# Patient Record
Sex: Female | Born: 2005 | Race: Black or African American | Hispanic: No | Marital: Single | Smoking: Never smoker
Health system: Southern US, Community
[De-identification: ages and names within clinical notes are randomized; demographics above are authoritative.]

## PROBLEM LIST (undated history)

## (undated) DIAGNOSIS — R17 Unspecified jaundice: Secondary | ICD-10-CM

## (undated) DIAGNOSIS — K429 Umbilical hernia without obstruction or gangrene: Secondary | ICD-10-CM

## (undated) DIAGNOSIS — L309 Dermatitis, unspecified: Secondary | ICD-10-CM

## (undated) DIAGNOSIS — J189 Pneumonia, unspecified organism: Secondary | ICD-10-CM

## (undated) HISTORY — PX: OTHER SURGICAL HISTORY: SHX169

## (undated) HISTORY — DX: Dermatitis, unspecified: L30.9

## (undated) HISTORY — DX: Unspecified jaundice: R17

## (undated) HISTORY — DX: Umbilical hernia without obstruction or gangrene: K42.9

## (undated) HISTORY — DX: Pneumonia, unspecified organism: J18.9

## (undated) HISTORY — PX: HERNIA REPAIR: SHX51

---
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
## 2023-02-13 ENCOUNTER — Encounter: Payer: Self-pay | Admitting: Internal Medicine

## 2023-02-13 MED ORDER — HAILEY 24 FE 1-20 MG-MCG(24) PO TABS: 1.0000 | ORAL_TABLET | Freq: Every day | ORAL | 3 refills | Status: DC

## 2023-06-04 ENCOUNTER — Encounter: Payer: Self-pay | Admitting: Internal Medicine

## 2023-07-02 ENCOUNTER — Telehealth (INDEPENDENT_AMBULATORY_CARE_PROVIDER_SITE_OTHER): Admitting: Internal Medicine

## 2023-07-02 VITALS — Ht 60.43 in | Wt 110.0 lb

## 2023-07-02 DIAGNOSIS — Z889 Allergy status to unspecified drugs, medicaments and biological substances status: Secondary | ICD-10-CM | POA: Diagnosis not present

## 2023-07-02 DIAGNOSIS — R220 Localized swelling, mass and lump, head: Secondary | ICD-10-CM | POA: Diagnosis not present

## 2023-07-02 DIAGNOSIS — Z91018 Allergy to other foods: Secondary | ICD-10-CM | POA: Diagnosis not present

## 2023-07-02 DIAGNOSIS — Z91013 Allergy to seafood: Secondary | ICD-10-CM | POA: Diagnosis not present

## 2023-07-02 NOTE — Progress Notes (Signed)
 Virtual Visit via Video Note  I connected with Karen Durham on 07/02/23 at  8:30 AM EDT by a video enabled telemedicine application and verified that I am speaking with the correct person using two identifiers. Location patient: home Location provider:work office Persons participating in the virtual visit: patient, provider   Patient aware  of the limitations of evaluation and management by telemedicine and  availability of in person appointments. and agreed to proceed.   HPI: [name] presents for video visit to disc allergy referral for an event that happened this spring Was getting hair done and had chocking episode   hard to breath and cough?  tried inhaler (given to her from health center in past) and noted  face was  swollen and eyes  took benadryl   ? Blanket  no other exposures.    No itching   swelling 2 days for  breathing got better with benadryl.  His of tree nuts fish t allergies  manifested with itching tongu and maybe swelling but has avoided for a long time  self rx benadryl if needed  moved out  of school dorm and cough is ? Better?  To go back in August for fall semester   of school .   Had bronchitis and  persistent cough  early winter but was recovered except ? A persistent cough .  She has been for hair  appts since then and no sx  the only thing different was the blanket used to cover and has avoided   No new meds  Currently she has no sx   Only reg med is ocps and doing ok withthis  ROS: See pertinent positives and negatives per HPI.  Past Medical History:  Diagnosis Date   Eczema    topical steroids and moisturizers   Failed vision screen 08/05/2012   Jaundice    bili 17   Pneumonia    Umbilical hernia     Past Surgical History:  Procedure Laterality Date   HERNIA REPAIR     umbi   surgical removal of supranumary digit tied off       Family History  Problem Relation Age of Onset   Hypertension Other        father's side   Allergy  (severe) Other     Social History   Tobacco Use   Smoking status: Never   Smokeless tobacco: Never  Vaping Use   Vaping status: Never Used  Substance Use Topics   Alcohol use: Not Currently   Drug use: Never      Current Outpatient Medications:    ibuprofen (ADVIL,MOTRIN) 200 MG tablet, Take 200 mg by mouth every 6 (six) hours as needed., Disp: , Rfl:    Norethindrone Acetate-Ethinyl Estrad-FE (HAILEY  24 FE) 1-20 MG-MCG(24) tablet, Take 1 tablet by mouth daily., Disp: 84 tablet, Rfl: 3   amoxicillin -clavulanate (AUGMENTIN ) 875-125 MG tablet, Take 1 tablet by mouth 2 (two) times daily. (Patient not taking: Reported on 07/02/2023), Disp: 20 tablet, Rfl: 0   azithromycin  (ZITHROMAX  Z-PAK) 250 MG tablet, As directed (Patient not taking: Reported on 07/02/2023), Disp: 6 each, Rfl: 0   benzonatate  (TESSALON ) 200 MG capsule, Take 1 capsule (200 mg total) by mouth 3 (three) times daily as needed for cough. (Patient not taking: Reported on 07/02/2023), Disp: 30 capsule, Rfl: 0  EXAM: BP Readings from Last 3 Encounters:  10/22/22 98/68  08/02/22 120/78  07/05/21 120/80 (88%, Z = 1.17 /  96%, Z =  1.75)*   *BP percentiles are based on the 2017 AAP Clinical Practice Guideline for girls    VITALS per patient if applicable:  GENERAL: alert, oriented, appears well and in no acute distress  HEENT: atraumatic, conjunttiva clear, no obvious abnormalities on inspection of external nose and ears  NECK: normal movements of the head and neck  LUNGS: on inspection no signs of respiratory distress, breathing rate appears normal, no obvious gross SOB, gasping or wheezing  CV: no obvious cyanosis  MS: moves all visible extremities without noticeable abnormality  PSYCH/NEURO: pleasant and cooperative, no obvious depression or anxiety, speech and thought processing grossly intact   ASSESSMENT AND PLAN:  Discussed the following assessment and plan:    ICD-10-CM   1. Swelling of face episode w resp  sx  R22.0    may have been angioedema resp sx  allergic reaction    2. 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   Expectant management and discussion of plan and treatment with opportunity to ask questions and all were answered. The patient agreed with the plan and demonstrated an understanding of the instructions.   Advised to call back or seek an in-person evaluation if worsening  or having  further concerns  in interim. Return for when planned.    Apolinar Eastern, MD

## 2023-07-02 NOTE — Patient Instructions (Addendum)
 Allergy referral  Seek emergent care if any return and carry benadryl for prn use Avoidance to continue

## 2023-08-04 NOTE — Progress Notes (Unsigned)
 New Patient Note  RE: Karen Durham MRN: [id number] DOB: 11-30-2005 Date of Office Visit: 08/05/2023  Consult requested by: Charlett Apolinar POUR, MD Primary care provider: Charlett Apolinar POUR, MD  Chief Complaint: No chief complaint on file.  History of Present Illness: I had the pleasure of seeing Karen Durham for initial evaluation at the [facility] on 08/05/2023. She is a 18 y.o. 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  Discussed the use of AI scribe software for clinical note transcription with the patient, who gave verbal consent to proceed.  History of Present Illness             Swelling started about *** ago. Mainly occurs on her ***. Describes them as ***. Individual swelling episodes last about ***. No ecchymosis upon resolution. Associated symptoms include: ***.  Frequency of episodes: ***. Suspected triggers are ***. Denies any *** fevers, chills, changes in medications, foods, personal care products or recent infections. She has tried the following therapies: *** with *** benefit. Systemic steroids ***. Currently on ***.  Previous work up includes: ***. Previous history of swelling: {Blank single:19197::yes,no}. Family history of angioedema: {Blank single:19197::yes,no}. Patient is up to date with the following cancer screening tests: ***. Ace-inhibitor use: {Blank single:19197::yes,no}  Assessment and Plan: Karen Durham is a 18 y.o. female with: ***  Assessment and Plan               No follow-ups on file.  No orders of the defined types were placed in this encounter.  Lab Orders  No laboratory test(s) ordered today    Other allergy screening: Asthma: {Blank single:19197::yes,no} Rhino conjunctivitis: {Blank single:19197::yes,no} Food allergy: {Blank single:19197::yes,no} Medication allergy: {Blank single:19197::yes,no} Hymenoptera allergy: {Blank  single:19197::yes,no} Urticaria: {Blank single:19197::yes,no} Eczema:{Blank single:19197::yes,no} History of recurrent infections suggestive of immunodeficency: {Blank single:19197::yes,no}  Diagnostics: Spirometry:  Tracings reviewed. Her effort: {Blank single:19197::Good reproducible efforts.,It was hard to get consistent efforts and there is a question as to whether this reflects a maximal maneuver.,Poor effort, data can not be interpreted.} FVC: ***L FEV1: ***L, ***% predicted FEV1/FVC ratio: ***% Interpretation: {Blank single:19197::Spirometry consistent with mild obstructive disease,Spirometry consistent with moderate obstructive disease,Spirometry consistent with severe obstructive disease,Spirometry consistent with possible restrictive disease,Spirometry consistent with mixed obstructive and restrictive disease,Spirometry uninterpretable due to technique,Spirometry consistent with normal pattern,No overt abnormalities noted given today's efforts}.  Please see scanned spirometry results for details.  Skin Testing: {Blank single:19197::Select foods,Environmental allergy panel,Environmental allergy panel and select foods,Food allergy panel,None,Deferred due to recent antihistamines use}. *** Results discussed with patient/family.   Past Medical History: Patient Active Problem List   Diagnosis Date Noted  . Wears glasses 06/23/2018  . Health check for child over 23 days old 02/03/2011  . Eczema 02/03/2011  . Eczema   . Allergic rhinitis 11/27/2006  . ALLERGY-FOOD 11/27/2006   Past Medical History:  Diagnosis Date  . Eczema    topical steroids and moisturizers  . Failed vision screen 08/05/2012  . Jaundice    bili 17  . Pneumonia   . Umbilical hernia    Past Surgical History: Past Surgical History:  Procedure Laterality Date  . HERNIA REPAIR     umbi  . surgical removal of supranumary digit tied off      Medication List:   Current Outpatient Medications  Medication Sig Dispense Refill  . amoxicillin -clavulanate (AUGMENTIN ) 875-125 MG tablet Take 1 tablet by mouth 2 (two) times daily. (Patient not taking:  Reported on 07/02/2023) 20 tablet 0  . azithromycin  (ZITHROMAX  Z-PAK) 250 MG tablet As directed (Patient not taking: Reported on 07/02/2023) 6 each 0  . benzonatate  (TESSALON ) 200 MG capsule Take 1 capsule (200 mg total) by mouth 3 (three) times daily as needed for cough. (Patient not taking: Reported on 07/02/2023) 30 capsule 0  . ibuprofen (ADVIL,MOTRIN) 200 MG tablet Take 200 mg by mouth every 6 (six) hours as needed.    . Norethindrone Acetate-Ethinyl Estrad-FE (HAILEY  24 FE) 1-20 MG-MCG(24) tablet Take 1 tablet by mouth daily. 84 tablet 3   No current facility-administered medications for this visit.   Allergies: Allergies  Allergen Reactions  . Fish Allergy    . Peanut-Containing Drug Products Hives   Social History: Social History   Socioeconomic History  . Marital status: Single    Spouse name: Not on file  . Number of children: Not on file  . Years of education: Not on file  . Highest education level: 12th grade  Occupational History  . Not on file  Tobacco Use  . Smoking status: Never  . Smokeless tobacco: Never  Vaping Use  . Vaping status: Never Used  Substance and Sexual Activity  . Alcohol use: Not Currently  . Drug use: Never  . Sexual activity: Never  Other Topics Concern  . Not on file  Social History Narrative   HH of 4   Younger sib at home chloe entering [employer] same school   No pets   Carliss and Sheldon Trompeter Parents      [employer]   [employer]  3 [employer]  Good grades    Social Drivers of Corporate investment banker Strain: Low Risk  (07/02/2023)   Overall Financial Resource Strain (CARDIA)   . Difficulty of Paying Living Expenses: Not hard at all  Food Insecurity: No Food Insecurity (07/02/2023)   Hunger Vital Sign   . Worried About Programme researcher, broadcasting/film/video in the Last Year:  Never true   . Ran Out of Food in the Last Year: Never true  Transportation Needs: No Transportation Needs (07/02/2023)   PRAPARE - Transportation   . Lack of Transportation (Medical): No   . Lack of Transportation (Non-Medical): No  Physical Activity: Sufficiently Active (07/02/2023)   Exercise Vital Sign   . Days of Exercise per Week: 5 days   . Minutes of Exercise per Session: 60 min  Stress: No Stress Concern Present (07/02/2023)   [employer] of Occupational Health - Occupational Stress Questionnaire   . Feeling of Stress: Only a little  Social Connections: Moderately Integrated (07/02/2023)   Social Connection and Isolation Panel   . Frequency of Communication with Friends and Family: More than three times a week   . Frequency of Social Gatherings with Friends and Family: More than three times a week   . Attends Religious Services: More than 4 times per year   . Active Member of Clubs or Organizations: Yes   . Attends Banker Meetings: More than 4 times per year   . Marital Status: Never married   Lives in a ***. Smoking: *** Occupation: ***  Environmental HistorySurveyor, minerals in the house: Network engineer in the family room: {Blank single:19197::yes,no} Carpet in the bedroom: {Blank single:19197::yes,no} Heating: {Blank single:19197::electric,gas,heat pump} Cooling: {Blank single:19197::central,window,heat pump} Pet: {Blank single:19197::yes ***,no}  Family History: Family History  Problem Relation Age of Onset  . Hypertension Other        father's side  .  Allergy  (severe) Other    Problem                               Relation Asthma                                   *** Eczema                                *** Food allergy                           *** Allergic rhino conjunctivitis     ***  Review of Systems  Constitutional:  Negative for appetite change, chills, fever and unexpected weight  change.  HENT:  Negative for congestion and rhinorrhea.   Eyes:  Negative for itching.  Respiratory:  Negative for cough, chest tightness, shortness of breath and wheezing.   Cardiovascular:  Negative for chest pain.  Gastrointestinal:  Negative for abdominal pain.  Genitourinary:  Negative for difficulty urinating.  Skin:  Negative for rash.  Neurological:  Negative for headaches.    Objective: There were no vitals taken for this visit. There is no height or weight on file to calculate BMI. Physical Exam Vitals and nursing note reviewed.  Constitutional:      Appearance: Normal appearance. She is well-developed.  HENT:     Head: Normocephalic and atraumatic.     Right Ear: Tympanic membrane and external ear normal.     Left Ear: Tympanic membrane and external ear normal.     Nose: Nose normal.     Mouth/Throat:     Mouth: Mucous membranes are moist.     Pharynx: Oropharynx is clear.  Eyes:     Conjunctiva/sclera: Conjunctivae normal.  Cardiovascular:     Rate and Rhythm: Normal rate and regular rhythm.     Heart sounds: Normal heart sounds. No murmur heard.    No friction rub. No gallop.  Pulmonary:     Effort: Pulmonary effort is normal.     Breath sounds: Normal breath sounds. No wheezing, rhonchi or rales.  Musculoskeletal:     Cervical back: Neck supple.  Skin:    General: Skin is warm.     Findings: No rash.  Neurological:     Mental Status: She is alert and oriented to person, place, and time.  Psychiatric:        Behavior: Behavior normal.   The plan was reviewed with the patient/family, and all questions/concerned were addressed.  It was my pleasure to see Kolina today and participate in her care. Please feel free to contact me with any questions or concerns.  Sincerely,  Orlan Cramp, DO Allergy  & Immunology  [facility]  [place] office: [phone] [facility] office: [phone]

## 2023-08-05 ENCOUNTER — Encounter: Payer: Self-pay | Admitting: Allergy

## 2023-08-05 ENCOUNTER — Other Ambulatory Visit: Payer: Self-pay

## 2023-08-05 ENCOUNTER — Ambulatory Visit (INDEPENDENT_AMBULATORY_CARE_PROVIDER_SITE_OTHER): Admitting: Allergy

## 2023-08-05 VITALS — BP 106/62 | HR 92 | Temp 98.7°F | Resp 20 | Ht 60.0 in | Wt 109.9 lb

## 2023-08-05 DIAGNOSIS — T7800XD Anaphylactic reaction due to unspecified food, subsequent encounter: Secondary | ICD-10-CM

## 2023-08-05 DIAGNOSIS — T7840XD Allergy, unspecified, subsequent encounter: Secondary | ICD-10-CM

## 2023-08-05 DIAGNOSIS — J3089 Other allergic rhinitis: Secondary | ICD-10-CM | POA: Diagnosis not present

## 2023-08-05 DIAGNOSIS — T7800XA Anaphylactic reaction due to unspecified food, initial encounter: Secondary | ICD-10-CM

## 2023-08-05 DIAGNOSIS — T7840XA Allergy, unspecified, initial encounter: Secondary | ICD-10-CM

## 2023-08-05 MED ORDER — EPINEPHRINE 0.3 MG/0.3ML IJ SOAJ: 0.3000 mg | INTRAMUSCULAR | 1 refills | Status: AC | PRN

## 2023-08-05 NOTE — Patient Instructions (Addendum)
 Allergic reaction Based on clinical history, unclear but concerning for potential environmental trigger.   Return for allergy skin testing. Will make additional recommendations based on results. Make sure you don't take any antihistamines for 3 days before the skin testing appointment. Don't put any lotion on the back and arms on the day of testing.  Must be in good health and not ill. No vaccines/injections/antibiotics within the past 7 days.  Plan on being here for 30-60 minutes.  Food allergies Continue strict avoidance of peanuts, tree nuts, finned fish, sesame seeds. I have prescribed epinephrine  injectable device and demonstrated proper use. For mild symptoms you can take over the counter antihistamines (zyrtec 10mg  to 20mg ) and monitor symptoms closely.  If symptoms worsen or if you have severe symptoms including breathing issues, throat closure, significant swelling, whole body hives, severe diarrhea and vomiting, lightheadedness then use epinephrine  and seek immediate medical care afterwards. Emergency action plan given.  Follow up for skin testing.

## 2023-08-14 ENCOUNTER — Encounter: Payer: Self-pay | Admitting: Internal Medicine

## 2023-08-14 ENCOUNTER — Ambulatory Visit (INDEPENDENT_AMBULATORY_CARE_PROVIDER_SITE_OTHER): Admitting: Internal Medicine

## 2023-08-14 DIAGNOSIS — L500 Allergic urticaria: Secondary | ICD-10-CM | POA: Diagnosis not present

## 2023-08-14 DIAGNOSIS — J3081 Allergic rhinitis due to animal (cat) (dog) hair and dander: Secondary | ICD-10-CM

## 2023-08-14 DIAGNOSIS — J3089 Other allergic rhinitis: Secondary | ICD-10-CM

## 2023-08-14 DIAGNOSIS — J301 Allergic rhinitis due to pollen: Secondary | ICD-10-CM

## 2023-08-14 MED ORDER — CETIRIZINE HCL 10 MG PO TABS: 10.0000 mg | ORAL_TABLET | Freq: Two times a day (BID) | ORAL | 5 refills | Status: AC | PRN

## 2023-08-14 MED ORDER — AZELASTINE HCL 0.1 % NA SOLN: 2.0000 | Freq: Two times a day (BID) | NASAL | 5 refills | Status: AC | PRN

## 2023-08-14 NOTE — Patient Instructions (Addendum)
 Allergic reaction Keep a diary of future reactions with any exposures such as foods/medications/illness.   Use Zyrtec  10mg  twice daily as needed for swelling/hives.   Food Allergy :  - please strictly avoid fish, peanut, treenut (except almond), sesame - for SKIN only reaction, okay to take Zyrtec  10 mg every 12 hours as needed - for SKIN + ANY additional symptoms, OR IF concern for LIFE THREATENING reaction = Epipen  Autoinjector EpiPen  0.3 mg. - If using Epinephrine  autoinjector, call 911 or go to the ER.   Allergic Rhinitis: - Positive skin test 08/2023: trees, grasses, weeds, cats, cockroach  - Use nasal saline rinses before nose sprays such as with Neilmed Sinus Rinse.  Use distilled water.   - Use Azelastine  1-2 sprays each nostril twice daily as needed for runny nose, drainage, sneezing, congestion. Aim upward and outward. - Use Zyrtec  10 mg daily.  - Consider allergy  shots as long term control of your symptoms by teaching your immune system to be more tolerant of your allergy  triggers   ALLERGEN AVOIDANCE MEASURES  Cockroach Limit spread of food around the house; especially keep food out of bedrooms. Keep food and garbage in closed containers with a tight lid.  Never leave food out in the kitchen.  Do not leave out pet food or dirty food bowls. Mop the kitchen floor and wash countertops at least once a week. Repair leaky pipes and faucets so there is no standing water to attract roaches. Plug up cracks in the house through which cockroaches can enter. Use bait stations and approved pesticides to reduce cockroach infestation. Pollen Avoidance Pollen levels are highest during the mid-day and afternoon.  Consider this when planning outdoor activities. Avoid being outside when the grass is being mowed, or wear a mask if the pollen-allergic person must be the one to mow the grass. Keep the windows closed to keep pollen outside of the home. Use an air conditioner to filter the  air. Take a shower, wash hair, and change clothing after working or playing outdoors during pollen season. Pet Dander Keep the pet out of your bedroom and restrict it to only a few rooms. Be advised that keeping the pet in only one room will not limit the allergens to that room. Don't pet, hug or kiss the pet; if you do, wash your hands with soap and water. High-efficiency particulate air (HEPA) cleaners run continuously in a bedroom or living room can reduce allergen levels over time. Regular use of a high-efficiency vacuum cleaner or a central vacuum can reduce allergen levels. Giving your pet a bath at least once a week can reduce airborne allergen.

## 2023-08-14 NOTE — Progress Notes (Signed)
 FOLLOW UP Date of Service/Encounter:  08/14/23   Subjective:  Naisha S Moseman (DOB: [birth year]) is a 18 y.o. female who [facility] for follow up for skin testing.   History obtained from: chart review and patient and mother.  Anti histamines held.  Eats almonds without issues. Does recall being sick at the time with bronchitis when the swelling episodes occurred. Also recalls it was all of a sudden cold that day.    Past Medical History: Past Medical History:  Diagnosis Date   Failed vision screen 08/05/2012   Jaundice    bili 19   Pneumonia    Umbilical hernia     Objective:  There were no vitals taken for this visit. There is no height or weight on file to calculate BMI. Physical Exam: GEN: alert, well developed HEENT: clear conjunctiva, MMM LUNGS: unlabored respiration   Skin Testing:  Skin prick testing was placed, which includes aeroallergens/foods, histamine control, and saline control.  Verbal consent was obtained prior to placing test.  Patient tolerated procedure well.  Allergy  testing results were read and interpreted by myself, documented by clinical staff. Adequate positive and negative control.  Positive results to:  Results discussed with patient/family.  Airborne Adult Perc - 08/14/23 1345     Time Antigen Placed 1345    Allergen Manufacturer Jestine    Location Back    Number of Test 55    1. Control-Buffer 50% Glycerol Negative    2. Control-Histamine 3+    3. Bahia Negative    4. French Southern Territories Negative    5. Johnson Negative    6. Kentucky  Blue Negative    7. Meadow Fescue Negative    8. Perennial Rye Negative    9. Timothy 3+    10. Ragweed Mix Negative    11. Cocklebur Negative    12. Plantain,  English Negative    13. Baccharis Negative    14. Dog Fennel Negative    15. Russian Thistle Negative    16. Lamb's Quarters Negative    17. Sheep Sorrell 3+    18. Rough Pigweed Negative    19. Marsh  Elder, Rough 3+    20. Mugwort, Common 2+    21. Box, Elder 3+    22. Cedar, red Negative    23. Sweet Gum Negative    24. Pecan Pollen Negative    25. Pine Mix Negative    26. Walnut, Black Pollen Negative    27. Red Mulberry Negative    28. Ash Mix 3+    29. Birch Mix Negative    30. Beech American 3+    31. Cottonwood, Guinea-Bissau Negative    32. Hickory, White Negative    33. Maple Mix Negative    34. Oak, Guinea-Bissau Mix Negative    35. Sycamore Eastern Negative    36. Alternaria Alternata Negative    37. Cladosporium Herbarum Negative    38. Aspergillus Mix Negative    39. Penicillium Mix Negative    40. Bipolaris Sorokiniana (Helminthosporium) Negative    41. Drechslera Spicifera (Curvularia) Negative    42. Mucor Plumbeus Negative    43. Fusarium Moniliforme Negative    44. Aureobasidium Pullulans (pullulara) Negative    45. Rhizopus Oryzae Negative    46. Botrytis Cinera Negative    47. Epicoccum Nigrum Negative    48. Phoma Betae Negative    49. Dust Mite Mix Negative    50. Cat Hair  10,000 BAU/ml 3+    51.  Dog Epithelia Negative    52. Mixed Feathers Negative    53. Horse Epithelia Negative    54. Cockroach, German 3+    55. Tobacco Leaf Negative          Food Adult Perc - 08/14/23 1300     Time Antigen Placed 1346    Allergen Manufacturer Jestine    Location Back    Number of allergen test 15    1. Peanut Negative    4. Sesame --   9x7   9. Fish Mix --   10x8   10. Cashew Negative    11. Walnut Food Negative    12. Almond Negative    13. Hazelnut Negative    14. Pecan Food --   7x6   15. Pistachio --   8x4   16. [place] Nut --   10x8   18. Trout Negative    19. Tuna Negative    20. Salmon Negative    21. Flounder Negative    22. Codfish Negative           Assessment:   1. Seasonal allergic rhinitis due to pollen   2. 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 Allergic rhinitis due to animal hair or dander   4. Allergic rhinitis due to  insect     Plan/Recommendations:  Allergic reaction Based on clinical history, unclear but concerning for potential environmental trigger as she had throat swelling + facial angioedema after contact with blanket. Discussed I am not convinced this was related to fabric/blanket exposure.  Could be related to the viral infection she had and other triggers include temperature changes/stress/infections.  Could also be idiopathic angioedema/urticaria.     Keep a diary of future reactions with any exposures such as foods/medications/illness.   Use Zyrtec  10mg  twice daily as needed for swelling/hives.   Food Allergy :  - please strictly avoid fish, peanut, treenut (except almond), sesame - If interested in consuming certain nuts in future, would recommend blood testing.   - Initial rxn: tree nuts with hives, throat swelling, wheezing. Peanuts and fish cause tongue itching. Sesame seeds cause mild tongue itching  - SPT 08/2023: positive to peanut, fish, pecan/pistachio/brazil nut.   - for SKIN only reaction, okay to take Zyrtec  10 mg every 12 hours as needed - for SKIN + ANY additional symptoms, OR IF concern for LIFE THREATENING reaction = Epipen  Autoinjector EpiPen  0.3 mg. - If using Epinephrine  autoinjector, call 911 or go to the ER.   Allergic Rhinitis: - Due to turbinate hypertrophy and unresponsive to over the counter meds, will perform skin testing to identify aeroallergen triggers.   - Positive skin test 08/2023: trees, grasses, weeds, cats, cockroach  - Avoidance measures discussed. - Use nasal saline rinses before nose sprays such as with Neilmed Sinus Rinse.  Use distilled water.   - Use Azelastine  1-2 sprays each nostril twice daily as needed for runny nose, drainage, sneezing, congestion. Aim upward and outward. - Use Zyrtec  10 mg daily.  - Consider allergy  shots as long term control of your symptoms by teaching your immune system to be more tolerant of your allergy  triggers     Return  in about 3 months (around 11/14/2023).  Arleta Blanch, MD [facility] 

## 2024-01-25 ENCOUNTER — Other Ambulatory Visit: Payer: Self-pay | Admitting: Internal Medicine

## 2024-01-28 ENCOUNTER — Encounter: Payer: Self-pay | Admitting: Internal Medicine

## 2024-01-28 ENCOUNTER — Ambulatory Visit (INDEPENDENT_AMBULATORY_CARE_PROVIDER_SITE_OTHER): Admitting: Internal Medicine

## 2024-01-28 VITALS — BP 120/68 | HR 92 | Temp 98.1°F | Ht 60.0 in | Wt 112.8 lb

## 2024-01-28 DIAGNOSIS — Z0000 Encounter for general adult medical examination without abnormal findings: Secondary | ICD-10-CM | POA: Diagnosis not present

## 2024-01-28 NOTE — Patient Instructions (Signed)
 Good to see you today   More sleep . Less sugar drinks   Continue lifestyle intervention healthy eating and exercise .   Take care of yourself.   Consider lab testing next year .

## 2024-01-28 NOTE — Progress Notes (Signed)
 "  Chief Complaint  Patient presents with   Annual Exam    HPI: Patient  Karen Durham  19 y.o. comes in today for Preventive Health Care visit  Partner   had chamydia.  She was positive   And rx doxycycline .     Through [facility] [employer] [facility]  finishing oup doxy .  Had  hiv and  syhilis screen  done there.  Had fu for 3 mos.  BTB this week .  Had missed late pill.  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  Carries benadryl  not with the epipen  for now .  Health Maintenance  Topic Date Due   Meningococcal B Vaccine (1 of 2 - Standard) Never done   Hepatitis C Screening  Never done   COVID-19 Vaccine (3 - 2025-26 season) 02/13/2024 (Originally 09/02/2023)   Influenza Vaccine  03/31/2024 (Originally 08/02/2023)   DTaP/Tdap/Td (8 - Td or Tdap) 08/11/2025   HPV VACCINES  Completed   HIV Screening  Completed   Pneumococcal Vaccine  Aged Out   Hepatitis B Vaccines 19-59 Average Risk  Discontinued   Health Maintenance Review LIFESTYLE:  Exercise:   yes  Tobacco/ETS: n  Alcohol:   ocass  Sugar beverages:  once a day  Sleep: about 6   Drug use: no Gummy apple cidar vinegar.  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 GI /GU: No adominal pain, vomiting, change in bowel habits. No blood in the stool.  SKIN/HEME: ,no acute skin rashes suspicious lesions or bleeding. No lymphadenopathy, nodules, masses.  NEURO/ PSYCH:  No neurologic signs such as weakness numbness. No depression anxiety. IMM/ Allergy : No unusual infections.  Allergy  .   REST of 12 system review negative except as per HPI   Past Medical History:  Diagnosis Date   Failed vision screen 08/05/2012   Jaundice    bili 17   Pneumonia     Umbilical hernia     Past Surgical History:  Procedure Laterality Date   HERNIA REPAIR     umbi   surgical removal of supranumary digit tied off       Family History  Problem Relation Age of Onset   Allergic rhinitis Mother    Allergic rhinitis Father    Hypertension Other        father's side   Allergy  (severe) Other     Social History   Socioeconomic History   Marital status: Single    Spouse name: Not on file   Number of children: Not on file   Years of education: Not on file   Highest education level: 12th grade  Occupational History   Not on file  Tobacco Use   Smoking status: Never   Smokeless tobacco: Never  Vaping Use   Vaping status: Never Used  Substance and Sexual Activity   Alcohol use: Never   Drug use: Never   Sexual activity: Never  Other Topics Concern   Not on file  Social History Narrative   HH of 4   Younger sib at home Karen Durham   No pets   Karen Durham and Karen Durham Karen Durham      Karen Durham  Karen Durham   elem rising  3 rd.  Good grades    Social Drivers of Health   Tobacco Use: Low Risk (01/28/2024)   Patient History    Smoking Tobacco Use: Never    Smokeless Tobacco Use: Never    Passive Exposure: Not on file  Financial Resource Strain: Low Risk (07/02/2023)   Overall Financial Resource Strain (CARDIA)    Difficulty of Paying Living Expenses: Not hard at all  Food Insecurity: No Food Insecurity (07/02/2023)   Epic    Worried About Karen Durham in the Last Year: Never true    Ran Out of Food in the Last Year: Never true  Transportation Needs: No Transportation Needs (07/02/2023)   Epic    Lack of Transportation (Medical): No    Lack of Transportation (Non-Medical): No  Physical Activity: Sufficiently Active (07/02/2023)   Exercise Vital Sign    Days of Exercise per Week: 5 days    Minutes of Exercise per Session: 60 min  Stress: No Stress Concern Present (07/02/2023)   Karen [place]    Feeling of Stress: Only a little  Social Connections: Moderately Integrated (07/02/2023)   Social Connection and Isolation Panel    Frequency of Communication with Friends and Family: More than three times a week    Frequency of Social Gatherings with Friends and Family: More than three times a week    Attends Religious Services: More than 4 times per year    Active Member of Clubs or Organizations: Yes    Attends Banker Meetings: More than 4 times per year    Marital Status: Never married  Depression (PHQ2-9): Low Risk (01/28/2024)   Depression (PHQ2-9)    PHQ-2 Score: 0  Alcohol Screen: Low Risk (07/02/2023)   Alcohol Screen    Last Alcohol Screening Score (AUDIT): 1  Housing: Unknown (07/02/2023)   Epic    Unable to Pay for Housing in the Last Year: No    Number of Times Moved in the Last Year: Not on file    Homeless in the Last Year: No  Utilities: Not on file  Health Literacy: Not on file    Outpatient Medications Prior to Visit  Medication Sig Dispense Refill   cetirizine  (ZYRTEC  ALLERGY ) 10 MG tablet Take 1 tablet (10 mg total) by mouth 2 (two) times daily as needed for allergies (hives or swelling). 60 tablet 5   HAILEY  24 FE 1-20 MG-MCG(24) tablet TAKE 1 TABLET BY MOUTH EVERY DAY 84 tablet 3   ibuprofen (ADVIL,MOTRIN) 200 MG tablet Take 200 mg by mouth every 6 (six) hours as needed.     azelastine  (ASTELIN ) 0.1 % nasal spray Place 2 sprays into both nostrils 2 (two) times daily as needed for allergies or rhinitis. Use in each nostril as directed (Patient not taking: Reported on 01/28/2024) 30 mL 5   EPINEPHrine  0.3 mg/0.3 mL IJ SOAJ injection Inject 0.3 mg into the muscle as needed for anaphylaxis. (Patient not taking: Reported on 01/28/2024) 2 each 1   No facility-administered medications prior to visit.     EXAM:  BP 120/68 (BP Location: Right Arm, Patient Position: Sitting, Cuff Size: Normal)   Pulse 92   Temp 98.1 F  (36.7 C) (Oral)   Ht 5' (1.524 m)   Wt 112 lb 12.8 oz (51.2 kg)   LMP 01/10/2024 (Exact Date)   SpO2 98%   BMI 22.03 kg/m   Body mass  index is 22.03 kg/m. Wt Readings from Last 3 Encounters:  01/28/24 112 lb 12.8 oz (51.2 kg)  08/05/23 109 lb 14.4 oz (49.9 kg) (15%, Z= -1.03)*  07/02/23 110 lb (49.9 kg) (15%, Z= -1.02)*   * Growth percentiles are based on CDC (Girls, 2-20 Years) data.    Physical Exam: Vital signs reviewed HZW:Uypd is a well-developed well-nourished alert cooperative    who appearsr stated age in no acute distress.  HEENT: normocephalic atraumatic , Eyes: PERRL EOM's full, conjunctiva clear, Nares: paten,t no deformity discharge or tenderness., Ears: no deformity EAC's clear TMs with normal landmarks. Mouth: clear OP, no lesions, edema.  Moist mucous membranes. Dentition in adequate repair. NECK: supple without masses, thyromegaly or bruits. CHEST/PULM:  Clear to auscultation and percussion breath sounds equal no wheeze , rales or rhonchi. No chest wall deformities or tenderness. Breast: normal by inspection . No dimpling, discharge, masses, tenderness or discharge . CV: PMI is nondisplaced, S1 S2 no gallops, murmurs, rubs. Peripheral pulses are full without delay.No JVD .  ABDOMEN: Bowel sounds normal nontender  No guard or rebound, no hepato splenomegal no CVA tenderness.   Extremtities:  No clubbing cyanosis or edema, no acute joint swelling or redness no focal atrophy NEURO:  Oriented x3, cranial nerves 3-12 appear to be intact, no obvious focal weakness,gait within normal limits no abnormal reflexes or asymmetrical SKIN: No acute rashes normal turgor, color, no bruising or petechiae. PSYCH: Oriented, good eye contact, no obvious depression anxiety, cognition and judgment appear normal. LN: no cervical axillary iadenopathy  Lab Results  Component Value Date   WBC 4.3 (L) 07/05/2021   HGB 13.3 07/05/2021   HCT 39.3 07/05/2021   PLT 366.0 07/05/2021    GLUCOSE 91 07/05/2021   CHOL 168 07/05/2021   TRIG 95.0 07/05/2021   HDL 56.80 07/05/2021   LDLCALC 92 07/05/2021   ALT 8 07/05/2021   AST 15 07/05/2021   NA 140 07/05/2021   K 3.8 07/05/2021   CL 105 07/05/2021   CREATININE 0.61 07/05/2021   BUN 12 07/05/2021   CO2 25 07/05/2021   TSH 0.67 07/05/2021    BP Readings from Last 3 Encounters:  01/28/24 120/68  08/05/23 106/62  10/22/22 98/68    ASSESSMENT AND PLAN:  Discussed the following assessment and plan:    ICD-10-CM   1. Visit for preventive health examination  Z00.00     Hx allergy    advise zyrtec  on hand  may have fewer se than benadryl.  Disc  sti  risks and precautions and she is aware . Under rx for chlamydia     BTB  unexplained  to check for chlamydia  but she is also working on  better ocp adherence to time .  Return in about 1 year (around 01/27/2025).  Patient Care Team: Ac Colan, Apolinar POUR, MD as PCP - General (Internal Medicine) Addie Cordella Hamilton, MD as Consulting Physician (Orthopedic Surgery) Patient Instructions  Good to see you today   More sleep . Less sugar drinks   Continue lifestyle intervention healthy eating and exercise .   Take care of yourself.   Consider lab testing next year .  Tamani Durney K. Telisa Ohlsen M.Karen.  "
